# Patient Record
Sex: Female | Born: 1970 | Race: Black or African American | Hispanic: No | Marital: Married | Smoking: Never smoker
Health system: Southern US, Community
[De-identification: ages and names within clinical notes are randomized; demographics above are authoritative.]

## PROBLEM LIST (undated history)

## (undated) DIAGNOSIS — Q180 Sinus, fistula and cyst of branchial cleft: Secondary | ICD-10-CM

## (undated) DIAGNOSIS — I10 Essential (primary) hypertension: Secondary | ICD-10-CM

## (undated) DIAGNOSIS — J45909 Unspecified asthma, uncomplicated: Secondary | ICD-10-CM

## (undated) HISTORY — PX: HERNIA REPAIR: SHX51

## (undated) HISTORY — PX: TONSILLECTOMY: SUR1361

## (undated) HISTORY — PX: CERVICAL ABLATION: SHX5771

## (undated) HISTORY — PX: HAND SURGERY: SHX662

---

## 1997-08-20 ENCOUNTER — Inpatient Hospital Stay (HOSPITAL_COMMUNITY): Admission: AD | Admit: 1997-08-20 | Discharge: 1997-08-20 | Payer: Self-pay | Admitting: Obstetrics & Gynecology

## 1997-09-17 ENCOUNTER — Encounter (HOSPITAL_COMMUNITY): Admission: RE | Admit: 1997-09-17 | Discharge: 1997-11-01 | Payer: Self-pay | Admitting: Obstetrics and Gynecology

## 1997-10-01 ENCOUNTER — Inpatient Hospital Stay (HOSPITAL_COMMUNITY): Admission: AD | Admit: 1997-10-01 | Discharge: 1997-10-03 | Payer: Self-pay | Admitting: Obstetrics and Gynecology

## 1997-10-29 ENCOUNTER — Inpatient Hospital Stay (HOSPITAL_COMMUNITY): Admission: AD | Admit: 1997-10-29 | Discharge: 1997-11-02 | Payer: Self-pay | Admitting: *Deleted

## 1997-11-03 ENCOUNTER — Inpatient Hospital Stay (HOSPITAL_COMMUNITY): Admission: AD | Admit: 1997-11-03 | Discharge: 1997-11-03 | Payer: Self-pay | Admitting: Obstetrics and Gynecology

## 1997-12-20 ENCOUNTER — Other Ambulatory Visit: Admission: RE | Admit: 1997-12-20 | Discharge: 1997-12-20 | Payer: Self-pay | Admitting: *Deleted

## 1998-01-06 ENCOUNTER — Ambulatory Visit (HOSPITAL_BASED_OUTPATIENT_CLINIC_OR_DEPARTMENT_OTHER): Admission: RE | Admit: 1998-01-06 | Discharge: 1998-01-06 | Payer: Self-pay | Admitting: Surgery

## 2010-08-21 ENCOUNTER — Ambulatory Visit (HOSPITAL_COMMUNITY): Admission: RE | Admit: 2010-08-21 | Payer: Self-pay | Source: Ambulatory Visit | Admitting: Obstetrics and Gynecology

## 2012-11-29 ENCOUNTER — Encounter (HOSPITAL_BASED_OUTPATIENT_CLINIC_OR_DEPARTMENT_OTHER): Payer: Self-pay | Admitting: *Deleted

## 2012-11-29 ENCOUNTER — Emergency Department (HOSPITAL_BASED_OUTPATIENT_CLINIC_OR_DEPARTMENT_OTHER)
Admission: EM | Admit: 2012-11-29 | Discharge: 2012-11-30 | Disposition: A | Discharge status: Home / Self Care | Payer: BC Managed Care – PPO | Attending: Emergency Medicine | Admitting: Emergency Medicine

## 2012-11-29 DIAGNOSIS — Z87798 Personal history of other (corrected) congenital malformations: Secondary | ICD-10-CM | POA: Insufficient documentation

## 2012-11-29 DIAGNOSIS — M542 Cervicalgia: Secondary | ICD-10-CM | POA: Insufficient documentation

## 2012-11-29 HISTORY — DX: Sinus, fistula and cyst of branchial cleft: Q18.0

## 2012-11-29 NOTE — ED Notes (Addendum)
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 C/O sore throat onset this a.m. Hurts to swallow "like something stuck in throat" Clr sputum.

## 2012-11-30 ENCOUNTER — Emergency Department (HOSPITAL_BASED_OUTPATIENT_CLINIC_OR_DEPARTMENT_OTHER): Payer: BC Managed Care – PPO

## 2012-11-30 LAB — BASIC METABOLIC PANEL
CO2: 24 mEq/L (ref 19–32)
Calcium: 9.1 mg/dL (ref 8.4–10.5)
Chloride: 103 mEq/L (ref 96–112)
Creatinine, Ser: 1.1 mg/dL (ref 0.50–1.10)
Glucose, Bld: 100 mg/dL — ABNORMAL HIGH (ref 70–99)

## 2012-11-30 LAB — CBC WITH DIFFERENTIAL/PLATELET
Basophils Absolute: 0 10*3/uL (ref 0.0–0.1)
Eosinophils Absolute: 0.2 10*3/uL (ref 0.0–0.7)
Eosinophils Relative: 2 % (ref 0–5)
HCT: 38.8 % (ref 36.0–46.0)
Lymphocytes Relative: 26 % (ref 12–46)
Lymphs Abs: 2.2 10*3/uL (ref 0.7–4.0)
MCH: 29.1 pg (ref 26.0–34.0)
MCV: 84.9 fL (ref 78.0–100.0)
Monocytes Absolute: 0.5 10*3/uL (ref 0.1–1.0)
Platelets: 185 10*3/uL (ref 150–400)
RDW: 12.1 % (ref 11.5–15.5)
WBC: 8.4 10*3/uL (ref 4.0–10.5)

## 2012-11-30 MED ORDER — IOHEXOL 300 MG/ML  SOLN
80.0000 mL | Freq: Once | INTRAMUSCULAR | Status: AC | PRN
  Administered 2012-11-30: 80 mL via INTRAVENOUS

## 2012-11-30 MED ORDER — SODIUM CHLORIDE 0.9 % IV SOLN: INTRAVENOUS | Status: DC

## 2012-11-30 NOTE — ED Provider Notes (Signed)
History    This chart was scribed for Lacey Seamen, MD by Quintella Reichert, ED scribe.  This patient was seen in room MH02/MH02 and the patient's care was started at 12:14 AM.   CSN: [id number]  Arrival date & time 11/29/12  2332      Chief Complaint  Patient presents with  . Sore Throat     The history is provided by the patient. No language interpreter was used.    HPI Comments: Lacey Lara is a 42 y.o. 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  Pain is exacerbated by swallowing and she states she has been spitting up mucus trying to clear her throat.  She reports that symptoms feel exactly like prior flare-ups of her cyst.  Patient has had multiple surgeries for her cyst and she states that the majority of it was excised.  She notes that she was hospitalized for a flair-up 4 years ago.  Her cyst has been diagnosed in the past via CT-scan.    Past Medical History  Diagnosis Date  . Cyst of branchial cleft     Past Surgical History  Procedure Laterality Date  . Tonsillectomy    . Cesarean section    . Hernia repair    . Hand surgery    . Cervical ablation      History reviewed. No pertinent family history.  History  Substance Use Topics  . Smoking status: Never Smoker   . Smokeless tobacco: Not on file  . Alcohol Use: No    OB History   Grav Para Term Preterm Abortions TAB SAB Ect Mult Living                  Review of Systems A complete 10 system review of systems was obtained and all systems are negative except as noted in the HPI and PMH.    Allergies  Review of patient's allergies indicates no known allergies.  Home Medications  No current outpatient prescriptions on file.  BP 152/99  Pulse 88  Temp(Src) 98.4 F (36.9 C) (Oral)  Resp 20  Ht 5\' 8"  (1.727 m)  Wt 185 lb (83.915 kg)  BMI 28.14 kg/m2  SpO2  100%  Physical Exam  Nursing note and vitals reviewed. General: Well-developed, well-nourished female in no acute distress; appearance consistent with age of record HENT: normocephalic, atraumatic  Slight asymmetry of posterior pharynx, slightly more prominent on left.   Eyes: pupils equal round and reactive to light; extraocular muscles intact Neck: supple. Soft tissue tenderness of left anterior neck.  Tactile fullness just above the center of an old well-healed surgical scar.   Heart: regular rate and rhythm; no murmurs, rubs or gallops Lungs: clear to auscultation bilaterally. No stridor. Abdomen: soft; nondistended; nontender; no masses or hepatosplenomegaly; bowel sounds present Extremities: No deformity; full range of motion; pulses normal Neurologic: Awake, alert and oriented; motor function intact in all extremities and symmetric; no facial droop Skin: Warm and dry Psychiatric: Normal mood and affect   ED Course  Procedures (including critical care time)  DIAGNOSTIC STUDIES: Oxygen Saturation is 100% on room air, normal by my interpretation.    COORDINATION OF CARE: 12:18 AM-Discussed treatment plan which includes CT-scan with pt at bedside and pt agreed to plan.      MDM   Nursing notes and vitals signs, including pulse oximetry, reviewed.  Summary of this visit's results,  reviewed by myself:  Labs:  Results for orders placed during the hospital encounter of 11/29/12 (from the past 24 hour(s))  BASIC METABOLIC PANEL     Status: Abnormal   Collection Time    11/30/12 12:54 AM      Result Value Range   Sodium 136  135 - 145 mEq/L   Potassium 3.3 (*) 3.5 - 5.1 mEq/L   Chloride 103  96 - 112 mEq/L   CO2 24  19 - 32 mEq/L   Glucose, Bld 100 (*) 70 - 99 mg/dL   BUN 14  6 - 23 mg/dL   Creatinine, Ser 6.29  0.50 - 1.10 mg/dL   Calcium 9.1  8.4 - 52.8 mg/dL   GFR calc non Af Amer 62 (*) >90 mL/min   GFR calc Af Amer 72 (*) >90 mL/min  CBC WITH DIFFERENTIAL      Status: None   Collection Time    11/30/12 12:54 AM      Result Value Range   WBC 8.4  4.0 - 10.5 K/uL   RBC 4.57  3.87 - 5.11 MIL/uL   Hemoglobin 13.3  12.0 - 15.0 g/dL   HCT 41.3  24.4 - 01.0 %   MCV 84.9  78.0 - 100.0 fL   MCH 29.1  26.0 - 34.0 pg   MCHC 34.3  30.0 - 36.0 g/dL   RDW 27.2  53.6 - 64.4 %   Platelets 185  150 - 400 K/uL   Neutrophils Relative % 65  43 - 77 %   Neutro Abs 5.5  1.7 - 7.7 K/uL   Lymphocytes Relative 26  12 - 46 %   Lymphs Abs 2.2  0.7 - 4.0 K/uL   Monocytes Relative 6  3 - 12 %   Monocytes Absolute 0.5  0.1 - 1.0 K/uL   Eosinophils Relative 2  0 - 5 %   Eosinophils Absolute 0.2  0.0 - 0.7 K/uL   Basophils Relative 0  0 - 1 %   Basophils Absolute 0.0  0.0 - 0.1 K/uL    Imaging Studies: Ct Soft Tissue Neck W Contrast  11/30/2012   *RADIOLOGY REPORT*  Clinical Data: Worsening left side sore throat, exacerbated by swallowing, spitting up mucus, trying to clear throat, past history of left branchial cleft cyst  CT NECK WITH CONTRAST  Technique:  Multidetector CT imaging of the neck was performed with intravenous contrast. Sagittal and coronal MPR images reconstructed from axial data set.  Contrast: 80mL OMNIPAQUE IOHEXOL 300 MG/ML  SOLN  Comparison: None  Findings: Visualized intracranial structures unremarkable. Vascular structures appear grossly patent. Lung apices clear. Question cleft versus scar within left thyroid lobe. Symmetric appearance of the parotid and submandibular glands. Scattered normal to upper normal-sized cervical lymph nodes bilaterally. Unremarkable parapharyngeal soft tissues.  No abnormal fluid collection identified in cervical region to suggest abscess or cyst. No mass or adenopathy. Degenerative disc disease changes at C5-C6 and C6-C7. Prevertebral soft tissues normal thickness.  IMPRESSION: No acute soft tissue abnormalities are seen within the neck. Specifically, no evidence of abscess or cyst identified.   Original Report Authenticated  By: Ulyses Southward, M.D.    2:00 AM Patient advised of CT findings. Should symptoms worsen we will refer her to ENT.      I personally performed the services described in this documentation, which was scribed in my presence.  The recorded information has been reviewed and is accurate.   Carlisle Beers Jeb Schloemer, MD 11/30/12 0200

## 2012-11-30 NOTE — ED Notes (Signed)
Pt reports Hx of Branchial cyst in throat that she had removed 1999 and reports was hospitalized four years ago for same. Currently states having trouble swallowing and feels the urge to spit frequently. Reports pain to Left neck 6-7/10. No stridor or SOB noted lungs clear

## 2012-11-30 NOTE — ED Notes (Signed)
Labs drawn with iv start 

## 2013-07-02 ENCOUNTER — Emergency Department (HOSPITAL_BASED_OUTPATIENT_CLINIC_OR_DEPARTMENT_OTHER): Payer: BC Managed Care – PPO

## 2013-07-02 ENCOUNTER — Encounter (HOSPITAL_BASED_OUTPATIENT_CLINIC_OR_DEPARTMENT_OTHER): Payer: Self-pay | Admitting: Emergency Medicine

## 2013-07-02 ENCOUNTER — Emergency Department (HOSPITAL_BASED_OUTPATIENT_CLINIC_OR_DEPARTMENT_OTHER)
Admission: EM | Admit: 2013-07-02 | Discharge: 2013-07-02 | Disposition: A | Discharge status: Home / Self Care | Payer: BC Managed Care – PPO | Attending: Emergency Medicine | Admitting: Emergency Medicine

## 2013-07-02 DIAGNOSIS — R000 Tachycardia, unspecified: Secondary | ICD-10-CM | POA: Insufficient documentation

## 2013-07-02 DIAGNOSIS — J45901 Unspecified asthma with (acute) exacerbation: Secondary | ICD-10-CM | POA: Insufficient documentation

## 2013-07-02 DIAGNOSIS — R079 Chest pain, unspecified: Secondary | ICD-10-CM

## 2013-07-02 DIAGNOSIS — R071 Chest pain on breathing: Secondary | ICD-10-CM | POA: Insufficient documentation

## 2013-07-02 DIAGNOSIS — R42 Dizziness and giddiness: Secondary | ICD-10-CM | POA: Insufficient documentation

## 2013-07-02 HISTORY — DX: Unspecified asthma, uncomplicated: J45.909

## 2013-07-02 LAB — CBC WITH DIFFERENTIAL/PLATELET
BASOS ABS: 0 10*3/uL (ref 0.0–0.1)
BASOS PCT: 0 % (ref 0–1)
EOS ABS: 0.1 10*3/uL (ref 0.0–0.7)
EOS PCT: 1 % (ref 0–5)
HCT: 41.4 % (ref 36.0–46.0)
Hemoglobin: 14 g/dL (ref 12.0–15.0)
Lymphocytes Relative: 27 % (ref 12–46)
Lymphs Abs: 1.6 10*3/uL (ref 0.7–4.0)
MCH: 28.8 pg (ref 26.0–34.0)
MCHC: 33.8 g/dL (ref 30.0–36.0)
MCV: 85.2 fL (ref 78.0–100.0)
Monocytes Absolute: 0.3 10*3/uL (ref 0.1–1.0)
Monocytes Relative: 4 % (ref 3–12)
Neutro Abs: 4.1 10*3/uL (ref 1.7–7.7)
Neutrophils Relative %: 68 % (ref 43–77)
PLATELETS: 186 10*3/uL (ref 150–400)
RBC: 4.86 MIL/uL (ref 3.87–5.11)
RDW: 12 % (ref 11.5–15.5)
WBC: 6 10*3/uL (ref 4.0–10.5)

## 2013-07-02 LAB — COMPREHENSIVE METABOLIC PANEL
ALBUMIN: 4 g/dL (ref 3.5–5.2)
ALK PHOS: 64 U/L (ref 39–117)
ALT: 12 U/L (ref 0–35)
AST: 18 U/L (ref 0–37)
BILIRUBIN TOTAL: 0.6 mg/dL (ref 0.3–1.2)
BUN: 10 mg/dL (ref 6–23)
CHLORIDE: 101 meq/L (ref 96–112)
CO2: 20 mEq/L (ref 19–32)
Calcium: 9.9 mg/dL (ref 8.4–10.5)
Creatinine, Ser: 1 mg/dL (ref 0.50–1.10)
GFR calc Af Amer: 81 mL/min — ABNORMAL LOW (ref >90)
GFR calc non Af Amer: 69 mL/min — ABNORMAL LOW (ref >90)
Glucose, Bld: 109 mg/dL — ABNORMAL HIGH (ref 70–99)
POTASSIUM: 3.6 meq/L — AB (ref 3.7–5.3)
Sodium: 138 mEq/L (ref 137–147)
Total Protein: 7.8 g/dL (ref 6.0–8.3)

## 2013-07-02 LAB — TROPONIN I: Troponin I: <0.3 ng/mL (ref <0.30)

## 2013-07-02 LAB — D-DIMER, QUANTITATIVE (NOT AT ARMC): D DIMER QUANT: 1.58 ug{FEU}/mL — AB (ref 0.00–0.48)

## 2013-07-02 MED ORDER — IOHEXOL 350 MG/ML SOLN
100.0000 mL | Freq: Once | INTRAVENOUS | Status: AC | PRN
  Administered 2013-07-02: 100 mL via INTRAVENOUS

## 2013-07-02 NOTE — Discharge Instructions (Signed)
Chest Pain (Nonspecific) °Chest pain has many causes. Your pain could be caused by something serious, such as a heart attack or a blood clot in the lungs. It could also be caused by something less serious, such as a chest bruise or a virus. Follow up with your doctor. More lab tests or other studies may be needed to find the cause of your pain. Most of the time, nonspecific chest pain will improve within 2 to 3 days of rest and mild pain medicine. °HOME CARE °· For chest bruises, you may put ice on the sore area for 15-20 minutes, 03-04 times a day. Do this only if it makes you feel better. °· Put ice in a plastic bag. °· Place a towel between the skin and the bag. °· Rest for the next 2 to 3 days. °· Go back to work if the pain improves. °· See your doctor if the pain lasts longer than 1 to 2 weeks. °· Only take medicine as told by your doctor. °· Quit smoking if you smoke. °GET HELP RIGHT AWAY IF:  °· There is more pain or pain that spreads to the arm, neck, jaw, back, or belly (abdomen). °· You have shortness of breath. °· You cough more than usual or cough up blood. °· You have very bad back or belly pain, feel sick to your stomach (nauseous), or throw up (vomit). °· You have very bad weakness. °· You pass out (faint). °· You have a fever. °Any of these problems may be serious and may be an emergency. Do not wait to see if the problems will go away. Get medical help right away. Call your local emergency services 911 in [place] Do not drive yourself to the hospital. °MAKE SURE YOU:  °· Understand these instructions. °· Will watch this condition. °· Will get help right away if you or your child is not doing well or gets worse. °Document Released: 11/14/2007 Document Revised: 08/20/2011 Document Reviewed: 11/14/2007 °[employer]® Patient Information ©2014 [employer]. ° °

## 2013-07-02 NOTE — ED Notes (Signed)
Pt c/o right chest pain x 6 days intermittent and aching with shortness of breath. Pt sts pain improves with rest and "burping".  Pt also sts she feels dizzy in the mornings but sts she recently started taking trazodone for sleep disorder and thinks it is related.

## 2013-07-02 NOTE — ED Provider Notes (Signed)
CSN: [id number]     Arrival date & time 07/02/13  1200 History   First MD Initiated Contact with Patient 07/02/13 1316     Chief Complaint  Patient presents with  . Chest Pain  . Shortness of Breath    HPI Patient presents with shortness of breath and intermittent pleuritic aching chest pain for the last 6 days.  Patient said that pain improves with rest and burping she'll some dizziness in the morning recently started taking trazodone for sleep disordered 6 this may related to side effects. Past Medical History  Diagnosis Date  . Cyst of branchial cleft   . Asthma    Past Surgical History  Procedure Laterality Date  . Tonsillectomy    . Cesarean section    . Hernia repair    . Hand surgery    . Cervical ablation     No family history on file. History  Substance Use Topics  . Smoking status: Never Smoker   . Smokeless tobacco: Not on file  . Alcohol Use: No   OB History   Grav Para Term Preterm Abortions TAB SAB Ect Mult Living                 Review of Systems All other systems reviewed and are negative Allergies  Review of patient's allergies indicates no known allergies.  Home Medications   Current Outpatient Rx  Name  Route  Sig  Dispense  Refill  . TRAZODONE HCL PO   Oral   Take by mouth.          There were no vitals taken for this visit. Physical Exam  Nursing note and vitals reviewed. Constitutional: She is oriented to person, place, and time. She appears well-developed and well-nourished. No distress.  HENT:  Head: Normocephalic and atraumatic.  Eyes: Pupils are equal, round, and reactive to light.  Neck: Normal range of motion.  Cardiovascular: Intact distal pulses.  Tachycardia present.   Pulmonary/Chest: No respiratory distress.  Abdominal: Normal appearance. She exhibits no distension.  Musculoskeletal: Normal range of motion.  Neurological: She is alert and oriented to person, place, and time. No cranial nerve deficit.  Skin: Skin is warm  and dry. No rash noted.  Psychiatric: She has a normal mood and affect. Her behavior is normal.    ED Course  Procedures (including critical care time) Labs Review Labs Reviewed  COMPREHENSIVE METABOLIC PANEL - Abnormal; Notable for the following:    Potassium 3.6 (*)    Glucose, Bld 109 (*)    GFR calc non Af Amer 69 (*)    GFR calc Af Amer 81 (*)    All other components within normal limits  D-DIMER, QUANTITATIVE - Abnormal; Notable for the following:    D-Dimer, Quant 1.58 (*)    All other components within normal limits  CBC WITH DIFFERENTIAL  TROPONIN I   Imaging Review Dg Chest 2 View  07/02/2013   CLINICAL DATA:  Short of breath  EXAM: CHEST  2 VIEW  COMPARISON:  None.  FINDINGS: The heart size and mediastinal contours are within normal limits. Both lungs are clear. The visualized skeletal structures are unremarkable.  IMPRESSION: No active cardiopulmonary disease.   Electronically Signed   By: Marlan Palauharles  Clark M.D.   On: 07/02/2013 14:15   Ct Angio Chest Pe W/cm &/or Wo Cm  07/02/2013     IMPRESSION: No pulmonary embolism.  No acute cardiopulmonary process.  Mild paraseptal and very minimal centrilobular emphysema with lung  base pleural thickening and atelectasis.   Electronically Signed   By: Awilda Metro   On: 07/02/2013 14:48    EKG Interpretation    Date/Time:  Thursday July 02 2013 12:09:43 EST Ventricular Rate:  101 PR Interval:  130 QRS Duration: 82 QT Interval:  330 QTC Calculation: 427 R Axis:   57 Text Interpretation:  Sinus tachycardia Otherwise normal ECG No previous tracing Confirmed by Becki Mccaskill  MD, Deneen Slager ([id number]) on 07/02/2013 1:33:26 PM            MDM   1. Chest pain        Nelia Shi, MD 07/02/13 1459

## 2016-01-09 ENCOUNTER — Encounter (HOSPITAL_BASED_OUTPATIENT_CLINIC_OR_DEPARTMENT_OTHER): Payer: Self-pay | Admitting: Emergency Medicine

## 2016-01-09 ENCOUNTER — Emergency Department (HOSPITAL_BASED_OUTPATIENT_CLINIC_OR_DEPARTMENT_OTHER)
Admission: EM | Admit: 2016-01-09 | Discharge: 2016-01-09 | Disposition: A | Discharge status: Home / Self Care | Payer: Self-pay | Attending: Emergency Medicine | Admitting: Emergency Medicine

## 2016-01-09 ENCOUNTER — Emergency Department (HOSPITAL_BASED_OUTPATIENT_CLINIC_OR_DEPARTMENT_OTHER): Payer: Self-pay

## 2016-01-09 DIAGNOSIS — J45909 Unspecified asthma, uncomplicated: Secondary | ICD-10-CM | POA: Insufficient documentation

## 2016-01-09 DIAGNOSIS — R42 Dizziness and giddiness: Secondary | ICD-10-CM | POA: Insufficient documentation

## 2016-01-09 DIAGNOSIS — R0789 Other chest pain: Secondary | ICD-10-CM | POA: Insufficient documentation

## 2016-01-09 DIAGNOSIS — R110 Nausea: Secondary | ICD-10-CM | POA: Insufficient documentation

## 2016-01-09 LAB — CBC WITH DIFFERENTIAL/PLATELET
BASOS ABS: 0 10*3/uL (ref 0.0–0.1)
Basophils Relative: 0 %
Eosinophils Absolute: 0.1 10*3/uL (ref 0.0–0.7)
Eosinophils Relative: 1 %
HCT: 39.5 % (ref 36.0–46.0)
HEMOGLOBIN: 13.3 g/dL (ref 12.0–15.0)
LYMPHS ABS: 2.9 10*3/uL (ref 0.7–4.0)
Lymphocytes Relative: 37 %
MCH: 28.7 pg (ref 26.0–34.0)
MCHC: 33.7 g/dL (ref 30.0–36.0)
MCV: 85.3 fL (ref 78.0–100.0)
MONO ABS: 0.2 10*3/uL (ref 0.1–1.0)
MONOS PCT: 2 %
NEUTROS PCT: 60 %
Neutro Abs: 4.6 10*3/uL (ref 1.7–7.7)
Platelets: 201 10*3/uL (ref 150–400)
RBC: 4.63 MIL/uL (ref 3.87–5.11)
RDW: 12.1 % (ref 11.5–15.5)
WBC: 7.8 10*3/uL (ref 4.0–10.5)

## 2016-01-09 LAB — TROPONIN I

## 2016-01-09 LAB — D-DIMER, QUANTITATIVE (NOT AT ARMC)

## 2016-01-09 LAB — BASIC METABOLIC PANEL
Anion gap: 7 (ref 5–15)
BUN: 11 mg/dL (ref 6–20)
CHLORIDE: 104 mmol/L (ref 101–111)
CO2: 25 mmol/L (ref 22–32)
Calcium: 8.9 mg/dL (ref 8.9–10.3)
Creatinine, Ser: 1.08 mg/dL — ABNORMAL HIGH (ref 0.44–1.00)
Glucose, Bld: 93 mg/dL (ref 65–99)
POTASSIUM: 3.7 mmol/L (ref 3.5–5.1)
SODIUM: 136 mmol/L (ref 135–145)

## 2016-01-09 MED ORDER — ONDANSETRON HCL 4 MG/2ML IJ SOLN
4.0000 mg | Freq: Once | INTRAMUSCULAR | Status: AC
  Administered 2016-01-09: 4 mg via INTRAVENOUS
  Filled 2016-01-09: qty 2

## 2016-01-09 MED ORDER — NAPROXEN 500 MG PO TABS: 500.0000 mg | ORAL_TABLET | Freq: Two times a day (BID) | ORAL | 0 refills | Status: DC

## 2016-01-09 MED ORDER — MORPHINE SULFATE (PF) 4 MG/ML IV SOLN
4.0000 mg | Freq: Once | INTRAVENOUS | Status: AC
  Administered 2016-01-09: 4 mg via INTRAVENOUS
  Filled 2016-01-09: qty 1

## 2016-01-09 MED ORDER — SODIUM CHLORIDE 0.9 % IV BOLUS (SEPSIS)
1000.0000 mL | Freq: Once | INTRAVENOUS | Status: AC
  Administered 2016-01-09: 1000 mL via INTRAVENOUS

## 2016-01-09 NOTE — ED Notes (Signed)
MD at bedside. 

## 2016-01-09 NOTE — ED Provider Notes (Signed)
[facility] Provider Note   CSN: [id number] Arrival date & time: 01/09/16  1555  First Provider Contact:   First MD Initiated Contact with Patient 01/09/16 1629      By signing my name below, I, Soijett Blue, attest that this documentation has been prepared under the direction and in the presence of Fayrene Helper, PA-C Electronically Signed: Soijett Blue, ED Scribe. 01/09/16. 4:40 PM   History   Chief Complaint Chief Complaint  Patient presents with  . Chest Pain    HPI  Lacey Lara is a 45 y.o. female with a medical hx of asthma who presents to the Emergency Department complaining of 5/10, sudden onset, left sided, CP onset 9:30 AM this morning. She notes that she was at a conference at work when the CP began. She describes the CP as pressure sensation to her chest and a paresthesia sensation to her body. Pt denies any strenuous activity prior to the onset of her symptoms. Pt states that her CP has mildly improved since onset. Pt reports that she has had multiple stress factors in her life recently, but she denies her symptoms feeling similar to a panic attack. Denies having CP like this in the past. Pt reports that her CP is worsened with deep breathing. Denies alleviating factors.   She states that she is having associated symptoms of nausea, lightheadedness, and belching. She states that she has not tried any medications for the relief for her symptoms. She denies vomiting, SOB, diaphoresis, back pain, abdominal pain, fever, chills, calf tenderness, leg swelling, nipple discharge, and any other symptoms. Denies ETOH use or smoking cigarettes. Pt reports that her mother died of CHF and that her father has had a quadruple bypass. Pt denies recent travel, immobilization, surgery, estrogen use, birth control use, or PMHx of blood clots. Denies allergies to any medications.    The history is provided by the patient. No language interpreter was used.    Past Medical  History:  Diagnosis Date  . Asthma   . Cyst of branchial cleft     There are no active problems to display for this patient.   Past Surgical History:  Procedure Laterality Date  . CERVICAL ABLATION    . CESAREAN SECTION    . HAND SURGERY    . HERNIA REPAIR    . TONSILLECTOMY      OB History    No data available       Home Medications    Prior to Admission medications   Not on File    Family History No family history on file.  Social History Social History  Substance Use Topics  . Smoking status: Never Smoker  . Smokeless tobacco: Not on file  . Alcohol use No     Allergies   Review of patient's allergies indicates no known allergies.   Review of Systems Review of Systems  Constitutional: Negative for diaphoresis.  Respiratory: Negative for shortness of breath.   Cardiovascular: Positive for chest pain. Negative for leg swelling.  Gastrointestinal: Positive for nausea. Negative for abdominal pain and vomiting.  Musculoskeletal: Negative for back pain and myalgias.  Neurological: Positive for light-headedness.     Physical Exam Updated Vital Signs BP 129/89 (BP Location: Right Arm)   Pulse 111   Temp 99.1 F (37.3 C) (Oral)   Resp 20   Ht  (1.702 m)   Wt 185 lb (83.9 kg)   SpO2 100%   BMI 28.98 kg/m   Physical  Exam  Constitutional: She is oriented to person, place, and time. She appears well-developed and well-nourished. No distress.  HENT:  Head: Normocephalic and atraumatic.  Eyes: EOM are normal.  Neck: Neck supple. Carotid bruit is not present.  No carotid bruit.   Cardiovascular: Normal rate, regular rhythm and normal heart sounds.  Exam reveals no gallop and no friction rub.   No murmur heard. Pulses:      Radial pulses are 2+ on the right side, and 2+ on the left side.  Radial pulse 2+ bilaterally.  Pulmonary/Chest: Effort normal and breath sounds normal. No respiratory distress. She has no wheezes. She has no rales. She  exhibits tenderness. She exhibits no crepitus.  Reproducible left upper chest wall TTP. No crepitus, emphysema, or overlying skin changes.  Abdominal: Soft. She exhibits no distension. There is no tenderness.  Musculoskeletal: Normal range of motion.  Neurological: She is alert and oriented to person, place, and time.  Skin: Skin is warm and dry.  Psychiatric: She has a normal mood and affect. Her behavior is normal.  Nursing note and vitals reviewed.    ED Treatments / Results  DIAGNOSTIC STUDIES: Oxygen Saturation is 100% on RA, nl by my interpretation.    COORDINATION OF CARE: 4:39 PM Discussed treatment plan with pt at bedside which includes EKG, labs, CXR, and pt agreed to plan.   Labs (all labs ordered are listed, but only abnormal results are displayed) Labs Reviewed  BASIC METABOLIC PANEL - Abnormal; Notable for the following:       Result Value   Creatinine, Ser 1.08 (*)    All other components within normal limits  CBC WITH DIFFERENTIAL/PLATELET  TROPONIN I  D-DIMER, QUANTITATIVE (NOT AT [facility])    EKG  EKG Interpretation None       Radiology Dg Chest 2 View  Result Date: 01/09/2016 CLINICAL DATA:  Shortness of breath with left-sided chest pain since this morning; history of asthma. EXAM: CHEST  2 VIEW COMPARISON:  Chest x-ray and chest CT scan of July 02, 2013 FINDINGS: The lungs are borderline hyperinflated. There is no pneumothorax, pneumomediastinum, or pleural effusion. There is no interstitial nor alveolar infiltrate. The heart and pulmonary vascularity are normal. The mediastinum is normal in width. The bony thorax exhibits no acute abnormality. IMPRESSION: There is no evidence of pneumonia nor other acute cardiopulmonary abnormality. Electronically Signed   By: David  [place] M.D.   On: 01/09/2016 16:59    Procedures Procedures (including critical care time)  Medications Ordered in ED Medications - No data to display   Initial Impression /  Assessment and Plan / ED Course  I have reviewed the triage vital signs and the nursing notes.  Pertinent labs & imaging results that were available during my care of the patient were reviewed by me and considered in my medical decision making (see chart for details).  Clinical Course    BP 108/78 (BP Location: Left Arm)   Pulse 86   Temp 99.1 F (37.3 C) (Oral)   Resp 18   Ht 5\' 7"  (1.702 m)   Wt 83.9 kg   SpO2 100%   BMI 28.98 kg/m    Final Clinical Impressions(s) / ED Diagnoses   Final diagnoses:  Chest wall pain    New Prescriptions New Prescriptions   NAPROXEN (NAPROSYN) 500 MG TABLET    Take 1 tablet (500 mg total) by mouth 2 (two) times daily.    I personally performed the services described in this documentation,  which was scribed in my presence. The recorded information has been reviewed and is accurate.     5:15 PM Pt here with reproducible L sided chest wall tenderness.  HEART score of 1, low risk of MACE.  PERC negative.    5:47 PM D-dimer negative.  Doubt PE.  Intact pulses to all 4 extremities without neuro deficits.  Doubt dissection.  Currently I have low suspicion that pt is having an acute emergent medical condition.  I will have pt /fu with PCP for further management.  Care discussed with Dr. Fayrene Fearing.  Return precaution discussed .     Fayrene Helper, PA-C 01/09/16 1756    Rolland Porter, MD 01/20/16 [phone]

## 2018-12-22 ENCOUNTER — Emergency Department (HOSPITAL_BASED_OUTPATIENT_CLINIC_OR_DEPARTMENT_OTHER)
Admission: EM | Admit: 2018-12-22 | Discharge: 2018-12-22 | Disposition: A | Discharge status: Home / Self Care | Payer: 59 | Attending: Emergency Medicine | Admitting: Emergency Medicine

## 2018-12-22 ENCOUNTER — Other Ambulatory Visit: Payer: Self-pay

## 2018-12-22 ENCOUNTER — Emergency Department (HOSPITAL_BASED_OUTPATIENT_CLINIC_OR_DEPARTMENT_OTHER): Payer: 59

## 2018-12-22 ENCOUNTER — Encounter (HOSPITAL_BASED_OUTPATIENT_CLINIC_OR_DEPARTMENT_OTHER): Payer: Self-pay | Admitting: *Deleted

## 2018-12-22 DIAGNOSIS — Z79899 Other long term (current) drug therapy: Secondary | ICD-10-CM | POA: Insufficient documentation

## 2018-12-22 DIAGNOSIS — J45901 Unspecified asthma with (acute) exacerbation: Secondary | ICD-10-CM | POA: Diagnosis not present

## 2018-12-22 DIAGNOSIS — J45909 Unspecified asthma, uncomplicated: Secondary | ICD-10-CM | POA: Insufficient documentation

## 2018-12-22 DIAGNOSIS — R05 Cough: Secondary | ICD-10-CM | POA: Diagnosis present

## 2018-12-22 MED ORDER — PREDNISONE 10 MG (21) PO TBPK: ORAL_TABLET | Freq: Every day | ORAL | 0 refills | Status: DC

## 2018-12-22 MED ORDER — BENZONATATE 100 MG PO CAPS: 100.0000 mg | ORAL_CAPSULE | Freq: Three times a day (TID) | ORAL | 0 refills | Status: DC

## 2018-12-22 MED ORDER — AEROCHAMBER PLUS FLO-VU MEDIUM MISC
1.0000 | Freq: Once | Status: AC
  Administered 2018-12-22: 1
  Filled 2018-12-22: qty 1

## 2018-12-22 MED ORDER — ALBUTEROL SULFATE HFA 108 (90 BASE) MCG/ACT IN AERS
2.0000 | INHALATION_SPRAY | Freq: Once | RESPIRATORY_TRACT | Status: AC
Start: 2018-12-22 — End: 2018-12-22
  Administered 2018-12-22: 2 via RESPIRATORY_TRACT
  Filled 2018-12-22: qty 6.7

## 2018-12-22 MED ORDER — IPRATROPIUM BROMIDE HFA 17 MCG/ACT IN AERS
2.0000 | INHALATION_SPRAY | Freq: Once | RESPIRATORY_TRACT | Status: AC
  Administered 2018-12-22: 20:00:00 2 via RESPIRATORY_TRACT
  Filled 2018-12-22: qty 12.9

## 2018-12-22 MED ORDER — FLUTICASONE PROPIONATE 50 MCG/ACT NA SUSP: 1.0000 | Freq: Every day | NASAL | 0 refills | Status: DC

## 2018-12-22 NOTE — ED Provider Notes (Signed)
[facility] EMERGENCY DEPARTMENT Provider Note   CSN: [id number] Arrival date & time: 12/22/18  1853     History   Chief Complaint Chief Complaint  Patient presents with  . Cough    HPI Lacey Lara is a 48 y.o. female with a history of asthma who presents to the ED with complaints of cough x 2 weeks. Patient reports productive cough with yellow phlegm sputum with nasal congestion which has now lead to some chest tightness as well as soreness with coughing & at times wheezing. Sxs seem to be worsening as opposed to improving. No specific alleviating/aggravating factors other than chest soreness worse w/ coughing. Tried OTC cold/cough medicines w/o relief. States she unfortunately does not have an inhaler at home to use. This feels similar to prior asthma issues requiring steroids. Denies fever, chills, dyspnea, ear pain, sore throat, leg pain/swelling, hemoptysis, recent surgery/trauma, recent long travel, hormone use, personal hx of cancer, or hx of DVT/PE. She has not been in contact w/ anyone that has been covid 19 positive that she is aware of.     HPI  Past Medical History:  Diagnosis Date  . Asthma   . Cyst of branchial cleft     There are no active problems to display for this patient.   Past Surgical History:  Procedure Laterality Date  . CERVICAL ABLATION    . CESAREAN SECTION    . HAND SURGERY    . HERNIA REPAIR    . TONSILLECTOMY       OB History   No obstetric history on file.      Home Medications    Prior to Admission medications   Medication Sig Start Date End Date Taking? Authorizing Provider  naproxen (NAPROSYN) 500 MG tablet Take 1 tablet (500 mg total) by mouth 2 (two) times daily. 01/09/16   Domenic Moras, PA-C    Family History No family history on file.  Social History Social History   Tobacco Use  . Smoking status: Never Smoker  . Smokeless tobacco: Never Used  Substance Use Topics  . Alcohol use: No  . Drug use: No     Allergies   Patient has no known allergies.   Review of Systems Review of Systems  Constitutional: Negative for chills and fever.  HENT: Positive for congestion. Negative for ear pain, sinus pain and sore throat.   Respiratory: Positive for cough, chest tightness and wheezing. Negative for shortness of breath.   Cardiovascular: Positive for chest pain (soreness w/ coughing).  Gastrointestinal: Negative for abdominal pain, diarrhea, nausea and vomiting.  Genitourinary: Negative for dysuria.  Musculoskeletal: Negative for myalgias.  Neurological: Negative for syncope.  All other systems reviewed and are negative.  Physical Exam Updated Vital Signs BP (!) 144/92   Pulse 100   Temp 98.4 F (36.9 C) (Oral)   Resp 20   Ht 5\' 7"  (1.702 m)   Wt 83.2 kg   SpO2 99%   BMI 28.72 kg/m   Physical Exam Vitals signs and nursing note reviewed.  Constitutional:      General: She is not in acute distress.    Appearance: She is well-developed.  HENT:     Head: Normocephalic and atraumatic.     Right Ear: Ear canal normal. Tympanic membrane is not perforated, erythematous, retracted or bulging.     Left Ear: Ear canal normal. Tympanic membrane is not perforated, erythematous, retracted or bulging.     Ears:     Comments: No  mastoid erythema/swelling/tenderness.     Nose: Congestion present.     Right Sinus: No maxillary sinus tenderness or frontal sinus tenderness.     Left Sinus: No maxillary sinus tenderness or frontal sinus tenderness.     Mouth/Throat:     Pharynx: Uvula midline. No oropharyngeal exudate or posterior oropharyngeal erythema.     Comments: Posterior oropharynx is symmetric appearing. Patient tolerating own secretions without difficulty. No trismus. No drooling. No hot potato voice. No swelling beneath the tongue, submandibular compartment is soft.  Eyes:     General:        Right eye: No discharge.        Left eye: No discharge.     Conjunctiva/sclera:  Conjunctivae normal.     Pupils: Pupils are equal, round, and reactive to light.  Neck:     Musculoskeletal: Normal range of motion and neck supple. No edema or neck rigidity.  Cardiovascular:     Rate and Rhythm: Normal rate and regular rhythm.     Heart sounds: No murmur.  Pulmonary:     Effort: Pulmonary effort is normal. No respiratory distress.     Breath sounds: Wheezing (end expiratory wheeze throughout all lung fields with somewhat poor air movement. ) present. No rhonchi or rales.  Chest:     Chest wall: Tenderness (mild anterior) present.  Abdominal:     General: There is no distension.     Palpations: Abdomen is soft.     Tenderness: There is no abdominal tenderness.  Musculoskeletal:     Right lower leg: No edema.     Left lower leg: No edema.  Lymphadenopathy:     Cervical: No cervical adenopathy.  Skin:    General: Skin is warm and dry.     Findings: No rash.  Neurological:     Mental Status: She is alert.  Psychiatric:        Behavior: Behavior normal.    ED Treatments / Results  Labs (all labs ordered are listed, but only abnormal results are displayed) Labs Reviewed - No data to display  EKG None  Radiology Dg Chest 2 View  Result Date: 12/22/2018 CLINICAL DATA:  Cough for 10 days with chest tightness. EXAM: CHEST - 2 VIEW COMPARISON:  Chest x-ray dated 01/09/2016. FINDINGS: Heart size and mediastinal contours are within normal limits. Lungs are clear. No pleural effusions seen. Osseous structures about the chest are unremarkable. IMPRESSION: No active cardiopulmonary disease. No evidence of pneumonia or pulmonary edema. Electronically Signed   By: Bary RichardStan  Maynard M.D.   On: 12/22/2018 20:37    Procedures Procedures (including critical care time)  Medications Ordered in ED Medications - No data to display   Initial Impression / Assessment and Plan / ED Course  I have reviewed the triage vital signs and the nursing notes.  Pertinent labs & imaging  results that were available during my care of the patient were reviewed by me and considered in my medical decision making (see chart for details).   Patient presents to the ED w/ congestion, cough, wheezing, & chest tightness worsening over past 2 weeks. Nontoxic appearing, no apparent distress, initial vitals w/ mildly elevated BP- doubt HTN emergency. Afebrile, no sinus tenderness doubt acute bacterial sinusitis. No AOM on exam. Centor 0- doubt strep. No meningismus. Low risk wells- doubt PE. Chest soreness reproducible w/ anterior chest wall palpation. She does not appear in respiratory distress, her lung exam is however notable for poor air movement w/ expiratory wheezing  throughout. Plan for albuterol/atrovent inhaler use & CXR w/ reassessment.  CXR: no active cardiopulmonary disease, no pneumonia or pulmonary edema.   Repeat lung exam improved, moving air appropriately, no significant wheezing.   Overall suspect allergic vs. Viral process leading to mild asthma exacerbation. Feel allergic is more likely, considered covid 19, afebrile, no body aches, 14 days from onset, seems less likely offered outpatient testing- patient declined. Will tx for asthma exacerbation with prednisone w/ additional symptomatic care, inhalers to be taken home. She is not hypoxic, appears appropriate for outpatient tx. I discussed results, treatment plan, need for follow-up, and return precautions with the patient. Provided opportunity for questions, patient confirmed understanding and is in agreement with plan.     Final Clinical Impressions(s) / ED Diagnoses   Final diagnoses:  Exacerbation of asthma, unspecified asthma severity, unspecified whether persistent    ED Discharge Orders         Ordered    predniSONE (STERAPRED UNI-PAK 21 TAB) 10 MG (21) TBPK tablet  Daily     12/22/18 2141    fluticasone (FLONASE) 50 MCG/ACT nasal spray  Daily     12/22/18 2141    benzonatate (TESSALON) 100 MG capsule  Every 8  hours     12/22/18 2141           Stoney Karczewski, Pleas KochSamantha R, PA-C 12/22/18 2155    Vanetta MuldersZackowski, Scott, MD 12/23/18 1630

## 2018-12-22 NOTE — Discharge Instructions (Addendum)
You are seen in the emergency department today for a cough.  Your chest x-ray was normal.  We are treating you for an asthma exacerbation with steroids, we are also sending home with Flonase to help with congestion, and Tessalon to help with cough.  Please take medicines as prescribed.  We are also sending you home with an albuterol inhaler, use 1 to 2 puffs every 4-6 hours as needed for shortness of breath/wheezing/chest tightness.   We have prescribed you new medication(s) today. Discuss the medications prescribed today with your pharmacist as they can have adverse effects and interactions with your other medicines including over the counter and prescribed medications. Seek medical evaluation if you start to experience new or abnormal symptoms after taking one of these medicines, seek care immediately if you start to experience difficulty breathing, feeling of your throat closing, facial swelling, or rash as these could be indications of a more serious allergic reaction  Follow-up with primary care within 3 days, return to the ER for new or worsening symptoms or any other concerns.

## 2018-12-22 NOTE — ED Triage Notes (Signed)
Cough for 10 days. No relief with OTC medications. Hx of asthma. Feels like she has bronchitis.

## 2019-09-06 ENCOUNTER — Emergency Department (HOSPITAL_BASED_OUTPATIENT_CLINIC_OR_DEPARTMENT_OTHER): Admission: EM | Admit: 2019-09-06 | Discharge: 2019-09-06 | Discharge status: Against Medical Advice | Payer: 59

## 2019-09-06 ENCOUNTER — Other Ambulatory Visit: Payer: Self-pay

## 2020-03-18 IMAGING — DX CHEST - 2 VIEW
2 series · 2 of 2 positions shown · non-contrast
Comparison: Chest x-ray dated 01/09/2016.

CLINICAL DATA: Cough for 10 days with chest tightness.

EXAM:
CHEST - 2 VIEW

[chest pa]
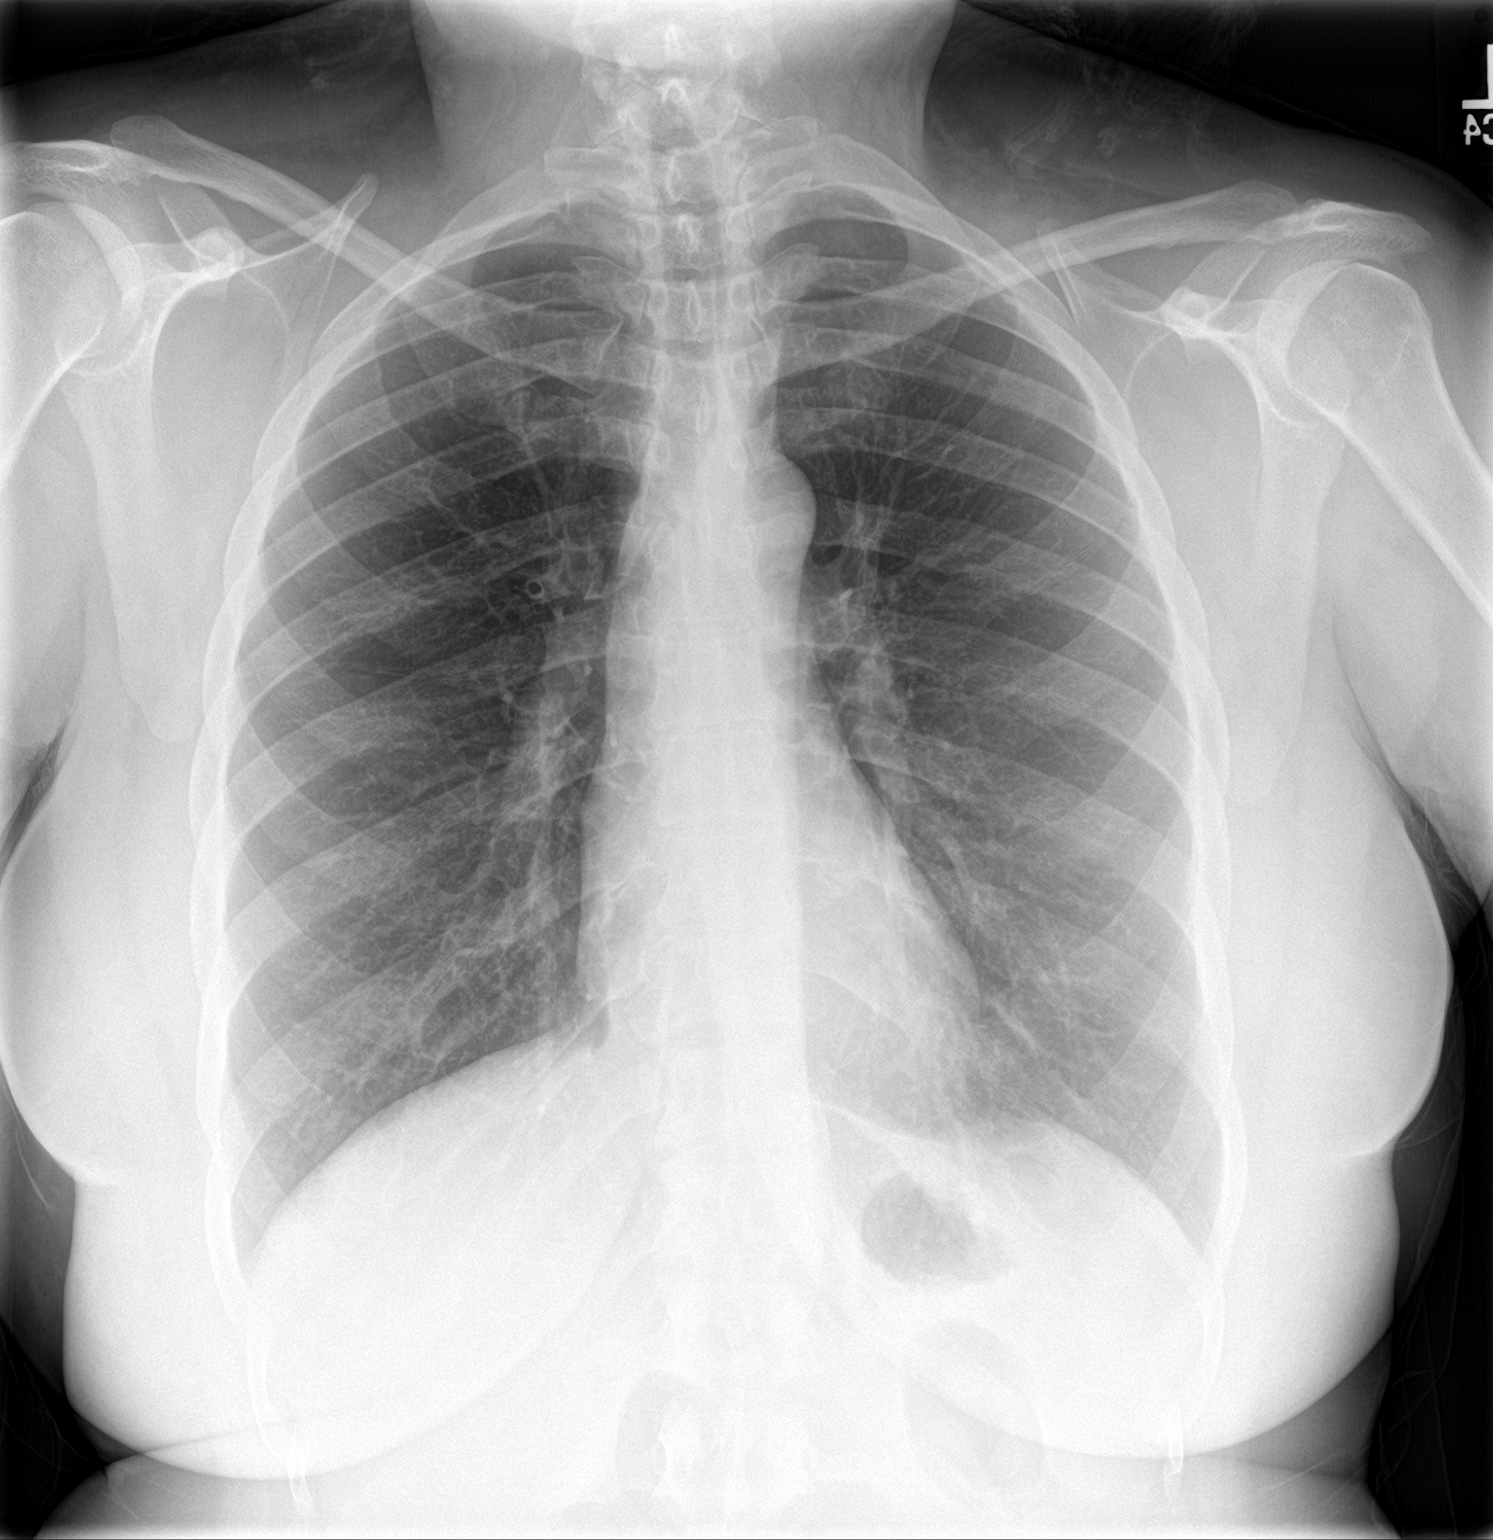

[chest lat]
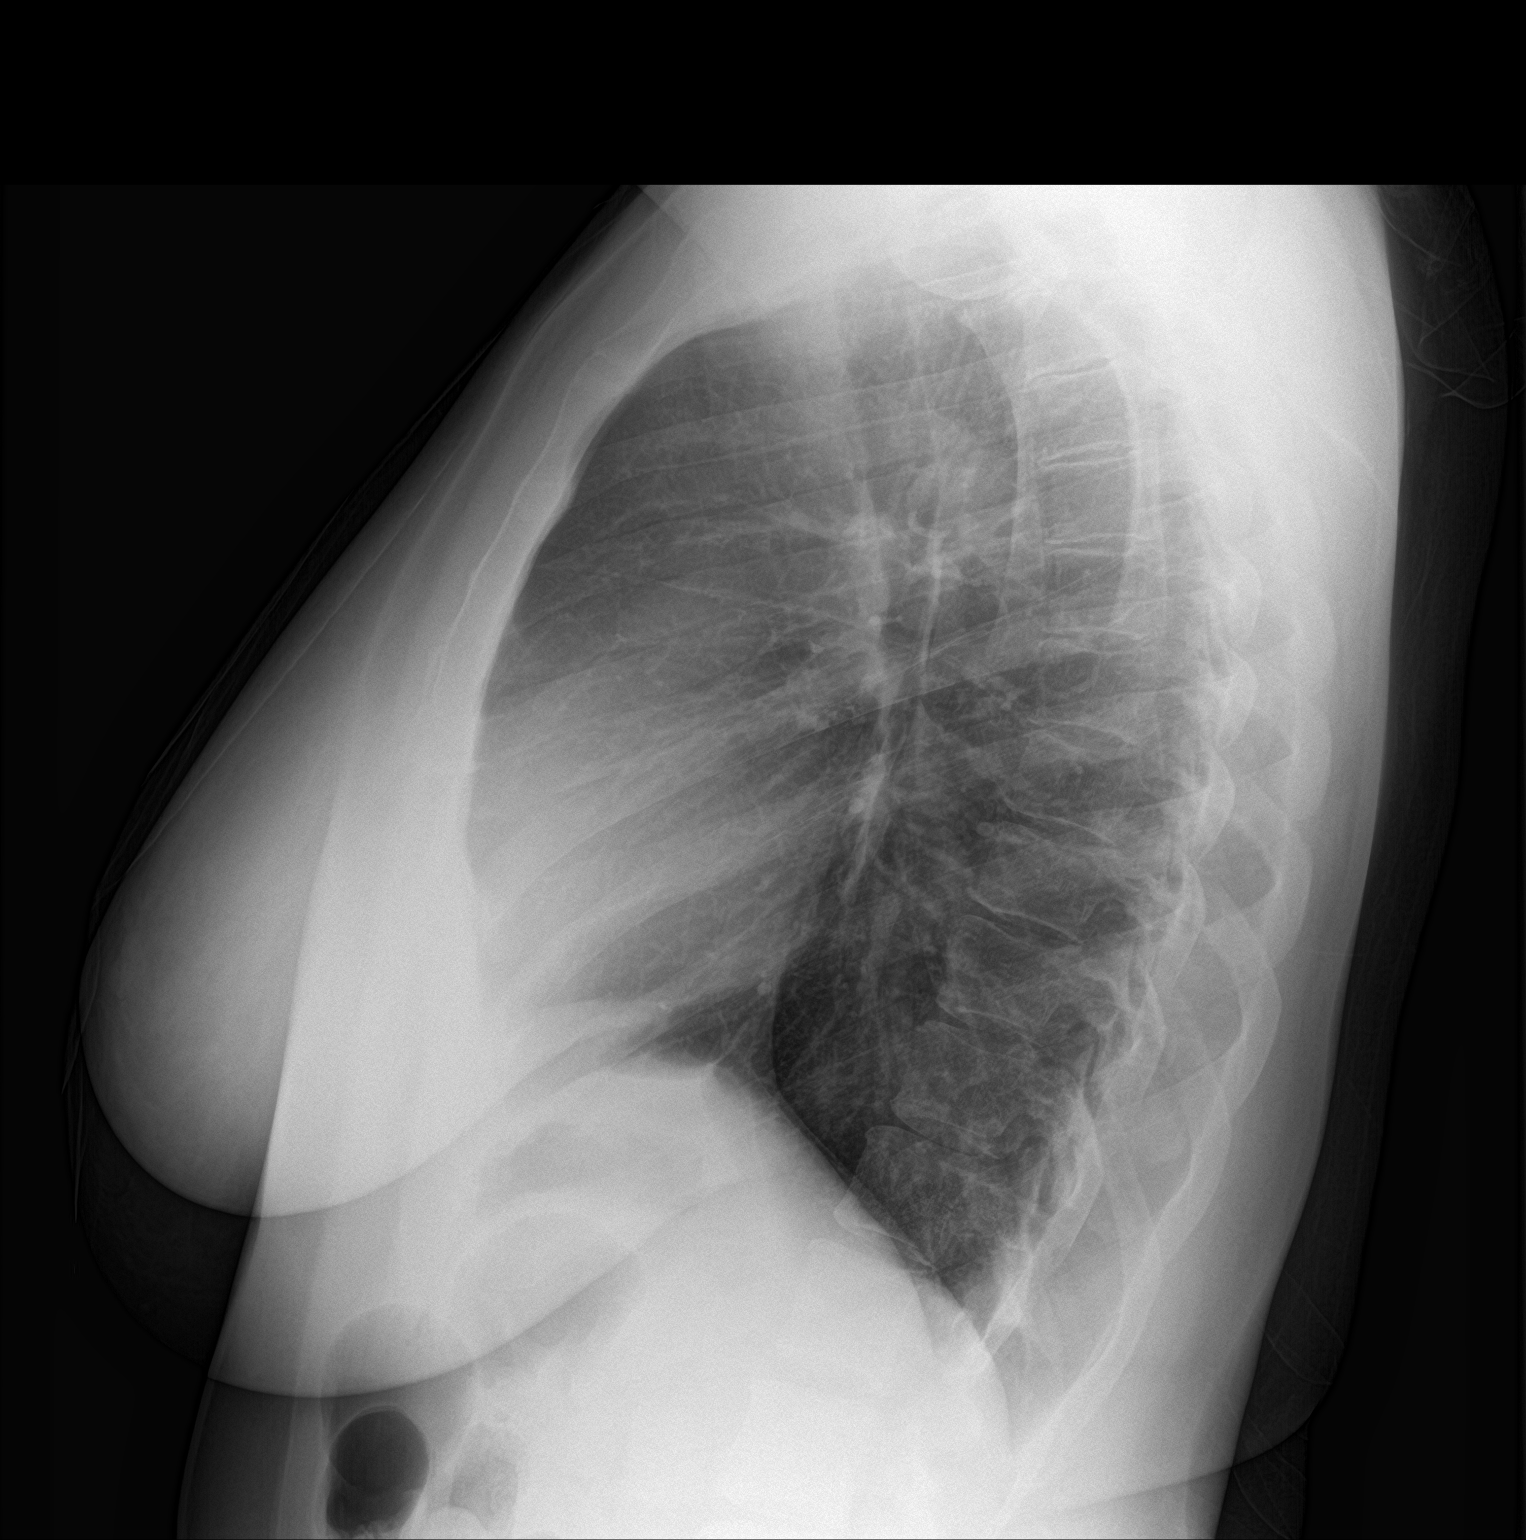

[2 of 2 positions shown; findings below may reference images not displayed]

FINDINGS: Heart size and mediastinal contours are within normal limits. Lungs
are clear. No pleural effusions seen. Osseous structures about the
chest are unremarkable.
IMPRESSION: No active cardiopulmonary disease. No evidence of pneumonia or
pulmonary edema.

## 2021-06-21 ENCOUNTER — Emergency Department (HOSPITAL_BASED_OUTPATIENT_CLINIC_OR_DEPARTMENT_OTHER)
Admission: EM | Admit: 2021-06-21 | Discharge: 2021-06-21 | Disposition: A | Discharge status: Home / Self Care | Payer: 59 | Attending: Emergency Medicine | Admitting: Emergency Medicine

## 2021-06-21 ENCOUNTER — Encounter (HOSPITAL_BASED_OUTPATIENT_CLINIC_OR_DEPARTMENT_OTHER): Payer: Self-pay

## 2021-06-21 ENCOUNTER — Other Ambulatory Visit: Payer: Self-pay

## 2021-06-21 DIAGNOSIS — L239 Allergic contact dermatitis, unspecified cause: Secondary | ICD-10-CM | POA: Insufficient documentation

## 2021-06-21 HISTORY — DX: Essential (primary) hypertension: I10

## 2021-06-21 MED ORDER — PREDNISONE 10 MG PO TABS: ORAL_TABLET | ORAL | 0 refills | Status: DC

## 2021-06-21 NOTE — ED Provider Notes (Signed)
[facility] EMERGENCY DEPARTMENT Provider Note   CSN: [id number] Arrival date & time: 06/21/21  1219     History  Chief Complaint  Patient presents with   Urticaria    Lacey Lara is a 51 y.o. female.  Pt complains of an itchy rash.  No no new medications no new foods.  Patient reports she has used over-the-counter hydrocortisone taken Benadryl and an old prescription for Atarax.  Patient denies any relief.  Patient denies any fever denies any chills she denies any joint swelling.  Patient reports she has similar in the past and was diagnosed with hives.  Patient reports she has had itching and has scratched several areas  The history is provided by the patient. No language interpreter was used.  Urticaria This is a new problem. The problem occurs constantly. The problem has not changed since onset.Pertinent negatives include no abdominal pain, no headaches and no shortness of breath. Nothing aggravates the symptoms. Nothing relieves the symptoms.      Home Medications Prior to Admission medications   Medication Sig Start Date End Date Taking? Authorizing Provider  predniSONE (DELTASONE) 10 MG tablet 6,5,4,3,2,1, taper 06/21/21  Yes Elson Areas, PA-C  benzonatate (TESSALON) 100 MG capsule Take 1 capsule (100 mg total) by mouth every 8 (eight) hours. 12/22/18   Petrucelli, Samantha R, PA-C  fluticasone (FLONASE) 50 MCG/ACT nasal spray Place 1 spray into both nostrils daily. 12/22/18   Petrucelli, Samantha R, PA-C  naproxen (NAPROSYN) 500 MG tablet Take 1 tablet (500 mg total) by mouth 2 (two) times daily. 01/09/16   Fayrene Helper, PA-C      Allergies    Shellfish allergy    Review of Systems   Review of Systems  Respiratory:  Negative for shortness of breath.   Gastrointestinal:  Negative for abdominal pain.  Neurological:  Negative for headaches.  All other systems reviewed and are negative.  Physical Exam Updated Vital Signs BP (!) 163/108 (BP Location:  Left Arm)    Pulse (!) 108    Temp 98.3 F (36.8 C) (Oral)    Resp 20    Ht 5\' 7"  (1.702 m)    Wt 83 kg    SpO2 100%    BMI 28.66 kg/m  Physical Exam Vitals and nursing note reviewed.  Constitutional:      Appearance: She is well-developed.  HENT:     Head: Normocephalic.     Nose: Nose normal.     Mouth/Throat:     Mouth: Mucous membranes are moist.  Eyes:     Pupils: Pupils are equal, round, and reactive to light.  Pulmonary:     Effort: Pulmonary effort is normal.  Abdominal:     General: There is no distension.  Musculoskeletal:        General: Normal range of motion.     Cervical back: Normal range of motion.  Skin:    Findings: Erythema and rash present.     Comments: Patient has multiple linear scratch marks.  Patient has small red raised areas  Neurological:     Mental Status: She is alert and oriented to person, place, and time.  Psychiatric:        Mood and Affect: Mood normal.    ED Results / Procedures / Treatments   Labs (all labs ordered are listed, but only abnormal results are displayed) Labs Reviewed - No data to display  EKG None  Radiology No results found.  Procedures Procedures  Medications Ordered in ED Medications - No data to display  ED Course/ Medical Decision Making/ A&P                           Medical Decision Making  Rash appears to be allergic.  Patient advised to stop topical hydrocortisone she can continue Atarax patient is given a prescription for prednisone taper.  Patient is advised to follow-up with her primary care physician next week for recheck.        Final Clinical Impression(s) / ED Diagnoses Final diagnoses:  Allergic dermatitis    Rx / DC Orders ED Discharge Orders          Ordered    predniSONE (DELTASONE) 10 MG tablet        06/21/21 1348           An After Visit Summary was printed and given to the patient.    Sidney Ace 06/21/21 1946    Truddie Hidden, MD 06/22/21  1325

## 2021-06-21 NOTE — ED Triage Notes (Signed)
Pt c/o scattered hives x 3 days-no relief with zyrtec and atarax-NAD-steady gait

## 2021-06-21 NOTE — Discharge Instructions (Addendum)
See your Physician for recheck next week  °

## 2022-01-19 ENCOUNTER — Encounter (HOSPITAL_COMMUNITY): Payer: Self-pay | Admitting: Emergency Medicine

## 2022-01-19 ENCOUNTER — Ambulatory Visit (HOSPITAL_COMMUNITY)
Admission: EM | Admit: 2022-01-19 | Discharge: 2022-01-19 | Disposition: A | Discharge status: Home / Self Care | Payer: Commercial Managed Care - HMO | Attending: Emergency Medicine | Admitting: Emergency Medicine

## 2022-01-19 DIAGNOSIS — J019 Acute sinusitis, unspecified: Secondary | ICD-10-CM | POA: Diagnosis not present

## 2022-01-19 MED ORDER — AMOXICILLIN-POT CLAVULANATE 875-125 MG PO TABS: 1.0000 | ORAL_TABLET | Freq: Two times a day (BID) | ORAL | 0 refills | Status: AC

## 2022-01-19 MED ORDER — ALBUTEROL SULFATE HFA 108 (90 BASE) MCG/ACT IN AERS: 2.0000 | INHALATION_SPRAY | Freq: Four times a day (QID) | RESPIRATORY_TRACT | 3 refills | Status: AC | PRN

## 2022-01-19 NOTE — ED Provider Notes (Signed)
[facility]    CSN: [id number] Arrival date & time: 01/19/22  1508      History   Chief Complaint Chief Complaint  Patient presents with   Headache   Cough   Fatigue    HPI Lacey Lara is a 51 y.o. female.  Presents with 10-day history of sinus pressure and pain.  Reports she has been congested with lots of postnasal drip.  Pain in the front of her face, especially between the eyebrows.  She has been trying Sudafed for her symptoms which worked for a couple days, but then symptoms returned.  The last 2 to 3 days she has developed some fatigue and cough.  Reports it is a dry cough that sometimes irritates her chest.  History of asthma, is low on her inhaler.  Denies any fever, chills, sore throat, shortness of breath or trouble breathing, chest pain, abdominal pain, nausea, vomiting/diarrhea.  Past Medical History:  Diagnosis Date   Asthma    Cyst of branchial cleft    Hypertension     There are no problems to display for this patient.   Past Surgical History:  Procedure Laterality Date   CERVICAL ABLATION     CESAREAN SECTION     HAND SURGERY     HERNIA REPAIR     TONSILLECTOMY      OB History   No obstetric history on file.      Home Medications    Prior to Admission medications   Medication Sig Start Date End Date Taking? Authorizing Provider  albuterol (VENTOLIN HFA) 108 (90 Base) MCG/ACT inhaler Inhale 2 puffs into the lungs every 6 (six) hours as needed for wheezing or shortness of breath. 01/19/22  Yes Adrien Shankar, Lurena Joiner, PA-C  amoxicillin-clavulanate (AUGMENTIN) 875-125 MG tablet Take 1 tablet by mouth 2 (two) times daily for 7 days. 01/19/22 01/26/22 Yes Clorinda Wyble, Ray Church    Family History History reviewed. No pertinent family history.  Social History Social History   Tobacco Use   Smoking status: Never   Smokeless tobacco: Never  Substance Use Topics   Alcohol use: Yes    Comment: occ   Drug use: No     Allergies    Shellfish allergy   Review of Systems Review of Systems  Respiratory:  Positive for cough.   Neurological:  Positive for headaches.   Per HPI  Physical Exam Triage Vital Signs ED Triage Vitals  Enc Vitals Group     BP 01/19/22 1540 123/87     Pulse Rate 01/19/22 1540 (!) 108     Resp 01/19/22 1540 18     Temp 01/19/22 1540 98 F (36.7 C)     Temp Source 01/19/22 1540 Oral     SpO2 01/19/22 1540 99 %     Weight --      Height --      Head Circumference --      Peak Flow --      Pain Score 01/19/22 1539 8     Pain Loc --      Pain Edu? --      Excl. in GC? --    No data found.  Updated Vital Signs BP 123/87 (BP Location: Right Arm)   Pulse (!) 108   Temp 98 F (36.7 C) (Oral)   Resp 18   SpO2 99%   Physical Exam Vitals and nursing note reviewed.  Constitutional:      General: She is not in acute distress. HENT:  Right Ear: Tympanic membrane and ear canal normal.     Left Ear: Tympanic membrane and ear canal normal.     Nose: Congestion present.     Right Turbinates: Enlarged.     Left Turbinates: Enlarged.     Right Sinus: Maxillary sinus tenderness and frontal sinus tenderness present.     Left Sinus: Maxillary sinus tenderness and frontal sinus tenderness present.     Mouth/Throat:     Mouth: Mucous membranes are moist.     Pharynx: Uvula midline. No posterior oropharyngeal erythema.     Tonsils: No tonsillar exudate or tonsillar abscesses.  Eyes:     Conjunctiva/sclera: Conjunctivae normal.     Pupils: Pupils are equal, round, and reactive to light.  Cardiovascular:     Rate and Rhythm: Normal rate and regular rhythm.     Heart sounds: Normal heart sounds.  Pulmonary:     Effort: Pulmonary effort is normal. No respiratory distress.     Breath sounds: Normal breath sounds. No wheezing.  Abdominal:     General: Bowel sounds are normal.     Palpations: Abdomen is soft.     Tenderness: There is no abdominal tenderness.  Musculoskeletal:      Cervical back: Normal range of motion.  Lymphadenopathy:     Cervical: No cervical adenopathy.  Neurological:     Mental Status: She is alert and oriented to person, place, and time.      UC Treatments / Results  Labs (all labs ordered are listed, but only abnormal results are displayed) Labs Reviewed - No data to display  EKG  Radiology No results found.  Procedures Procedures   Medications Ordered in UC Medications - No data to display  Initial Impression / Assessment and Plan / UC Course  I have reviewed the triage vital signs and the nursing notes.  Pertinent labs & imaging results that were available during my care of the patient were reviewed by me and considered in my medical decision making (see chart for details).  With duration of symptoms it is likely bacterial sinus infection.  We will treat with Augmentin twice daily for 7 days.  Patient understands to return if she feels the medicine does not improve her symptoms.  Other symptomatic care at home as needed.  Refilled inhaler.  Lung exam clear, patient denies any shortness of breath or chest pain.  Strict return precautions and ED precautions.  Patient agrees to plan  Final Clinical Impressions(s) / UC Diagnoses   Final diagnoses:  Acute non-recurrent sinusitis, unspecified location     Discharge Instructions      Please take medication as prescribed. I recommend to take with food.  I have refilled your inhaler. Please go to the emergency department if symptoms worsen.    ED Prescriptions     Medication Sig Dispense Auth. Provider   albuterol (VENTOLIN HFA) 108 (90 Base) MCG/ACT inhaler Inhale 2 puffs into the lungs every 6 (six) hours as needed for wheezing or shortness of breath. 18 g Lillianne Eick, PA-C   amoxicillin-clavulanate (AUGMENTIN) 875-125 MG tablet Take 1 tablet by mouth 2 (two) times daily for 7 days. 14 tablet Kahari Critzer, Lurena Joiner, PA-C      PDMP not reviewed this encounter.   Blane Worthington,  Ray Church 01/19/22 1704

## 2022-01-19 NOTE — Discharge Instructions (Addendum)
Please take medication as prescribed. I recommend to take with food.  I have refilled your inhaler. Please go to the emergency department if symptoms worsen.

## 2022-01-19 NOTE — ED Triage Notes (Signed)
Pt reports pressure in the front of her head and pressure behind eyes x 10 days. States she tried OTC Sudafed with no improvement. Also c/o chest tightness, fatigue, post nasal drainage and pain when coughing x 7 days. Hx of Asthma. Has been using inhaler. Last use of inhaler yesterday evening.

## 2022-07-30 ENCOUNTER — Ambulatory Visit (HOSPITAL_COMMUNITY)
Admission: EM | Admit: 2022-07-30 | Discharge: 2022-07-30 | Disposition: A | Discharge status: Home / Self Care | Payer: Commercial Managed Care - HMO | Attending: Family Medicine | Admitting: Family Medicine

## 2022-07-30 ENCOUNTER — Other Ambulatory Visit: Payer: Self-pay

## 2022-07-30 ENCOUNTER — Encounter (HOSPITAL_COMMUNITY): Payer: Self-pay | Admitting: *Deleted

## 2022-07-30 DIAGNOSIS — R8290 Unspecified abnormal findings in urine: Secondary | ICD-10-CM | POA: Insufficient documentation

## 2022-07-30 DIAGNOSIS — R102 Pelvic and perineal pain: Secondary | ICD-10-CM | POA: Insufficient documentation

## 2022-07-30 LAB — POCT URINALYSIS DIPSTICK, ED / UC
Bilirubin Urine: NEGATIVE
Glucose, UA: NEGATIVE mg/dL
Hgb urine dipstick: NEGATIVE
Ketones, ur: NEGATIVE mg/dL
Leukocytes,Ua: NEGATIVE
Nitrite: NEGATIVE
Protein, ur: NEGATIVE mg/dL
Specific Gravity, Urine: 1.025 (ref 1.005–1.030)
Urobilinogen, UA: 0.2 mg/dL (ref 0.0–1.0)
pH: 6 (ref 5.0–8.0)

## 2022-07-30 MED ORDER — NITROFURANTOIN MONOHYD MACRO 100 MG PO CAPS: 100.0000 mg | ORAL_CAPSULE | Freq: Two times a day (BID) | ORAL | 0 refills | Status: AC

## 2022-07-30 NOTE — ED Triage Notes (Signed)
Pt reports a strong odor to urin. Pt also has back and ABD pain. This has been going on for 2 weeks.

## 2022-07-30 NOTE — Discharge Instructions (Addendum)
The urinalysis was clear  We are sending a urine culture.  Staff will notify you if the the antibiotic needs to be changed.  Take nitrofurantoin 100 mg--1 capsule 2 times daily for 5 days   Staff will also notify you if there is anything positive on the swab

## 2022-07-30 NOTE — ED Provider Notes (Signed)
[facility]    CSN: [id number] Arrival date & time: 07/30/22  1631      History   Chief Complaint Chief Complaint  Patient presents with   Abdominal Pain    My urine has a strong smell of Amonia. Can't get rid of it. Adominal Pain. Back pain. Fatigue. - Entered by patient    HPI Lacey Lara is a 52 y.o. female.    Abdominal Pain  Here for a 2-week history of her urine being very strong smelling.  It smells like ammonia.  She is also had some suprapubic pain and some low back pain.  She has not had any urinary frequency or dysuria.  No fever or chills.  Last menstrual cycle was 10 years ago when she had an ablation.  Past Medical History:  Diagnosis Date   Asthma    Cyst of branchial cleft    Hypertension     There are no problems to display for this patient.   Past Surgical History:  Procedure Laterality Date   CERVICAL ABLATION     CESAREAN SECTION     HAND SURGERY     HERNIA REPAIR     TONSILLECTOMY      OB History   No obstetric history on file.      Home Medications    Prior to Admission medications   Medication Sig Start Date End Date Taking? Authorizing Provider  nitrofurantoin, macrocrystal-monohydrate, (MACROBID) 100 MG capsule Take 1 capsule (100 mg total) by mouth 2 (two) times daily. 07/30/22  Yes Barrett Henle, MD  albuterol (VENTOLIN HFA) 108 (90 Base) MCG/ACT inhaler Inhale 2 puffs into the lungs every 6 (six) hours as needed for wheezing or shortness of breath. 01/19/22   Rising, Vernice Jefferson    Family History History reviewed. No pertinent family history.  Social History Social History   Tobacco Use   Smoking status: Never   Smokeless tobacco: Never  Substance Use Topics   Alcohol use: Yes    Comment: occ   Drug use: No     Allergies   Shellfish allergy   Review of Systems Review of Systems  Gastrointestinal:  Positive for abdominal pain.     Physical Exam Triage Vital Signs ED Triage Vitals   Enc Vitals Group     BP 07/30/22 1820 135/88     Pulse Rate 07/30/22 1820 83     Resp 07/30/22 1820 18     Temp 07/30/22 1820 97.8 F (36.6 C)     Temp src --      SpO2 07/30/22 1820 99 %     Weight --      Height --      Head Circumference --      Peak Flow --      Pain Score 07/30/22 1817 4     Pain Loc --      Pain Edu? --      Excl. in [place]? --    No data found.  Updated Vital Signs BP 135/88   Pulse 83   Temp 97.8 F (36.6 C)   Resp 18   SpO2 99%   Visual Acuity Right Eye Distance:   Left Eye Distance:   Bilateral Distance:    Right Eye Near:   Left Eye Near:    Bilateral Near:     Physical Exam Vitals reviewed.  Constitutional:      General: She is not in acute distress.    Appearance: She  is not toxic-appearing.  HENT:     Nose: Nose normal.     Mouth/Throat:     Mouth: Mucous membranes are moist.     Pharynx: No oropharyngeal exudate or posterior oropharyngeal erythema.  Eyes:     Extraocular Movements: Extraocular movements intact.     Conjunctiva/sclera: Conjunctivae normal.     Pupils: Pupils are equal, round, and reactive to light.  Cardiovascular:     Rate and Rhythm: Normal rate and regular rhythm.     Heart sounds: No murmur heard. Pulmonary:     Effort: Pulmonary effort is normal. No respiratory distress.     Breath sounds: No wheezing, rhonchi or rales.  Chest:     Chest wall: No tenderness.  Abdominal:     Palpations: Abdomen is soft.     Tenderness: There is abdominal tenderness (suprapubic area).  Musculoskeletal:     Cervical back: Neck supple.  Lymphadenopathy:     Cervical: No cervical adenopathy.  Skin:    Capillary Refill: Capillary refill takes less than 2 seconds.     Coloration: Skin is not jaundiced or pale.  Neurological:     General: No focal deficit present.     Mental Status: She is alert and oriented to person, place, and time.  Psychiatric:        Behavior: Behavior normal.      UC Treatments / Results   Labs (all labs ordered are listed, but only abnormal results are displayed) Labs Reviewed  URINE CULTURE  POCT URINALYSIS DIPSTICK, ED / UC  CERVICOVAGINAL ANCILLARY ONLY    EKG   Radiology No results found.  Procedures Procedures (including critical care time)  Medications Ordered in UC Medications - No data to display  Initial Impression / Assessment and Plan / UC Course  I have reviewed the triage vital signs and the nursing notes.  Pertinent labs & imaging results that were available during my care of the patient were reviewed by me and considered in my medical decision making (see chart for details).        Urinalysis is clear.  Urine culture is sent and swab is also done.  Staff will notify her of any positives and treat per protocol Final Clinical Impressions(s) / UC Diagnoses   Final diagnoses:  Pelvic pain in female  Malodorous urine     Discharge Instructions      The urinalysis was clear  We are sending a urine culture.  Staff will notify you if the the antibiotic needs to be changed.  Take nitrofurantoin 100 mg--1 capsule 2 times daily for 5 days   Staff will also notify you if there is anything positive on the swab     ED Prescriptions     Medication Sig Dispense Auth. Provider   nitrofurantoin, macrocrystal-monohydrate, (MACROBID) 100 MG capsule Take 1 capsule (100 mg total) by mouth 2 (two) times daily. 10 capsule Barrett Henle, MD      PDMP not reviewed this encounter.   Barrett Henle, MD 07/30/22 [id number]

## 2022-07-31 LAB — CERVICOVAGINAL ANCILLARY ONLY
Bacterial Vaginitis (gardnerella): NEGATIVE
Candida Glabrata: NEGATIVE
Candida Vaginitis: NEGATIVE
Chlamydia: NEGATIVE
Comment: NEGATIVE
Comment: NEGATIVE
Comment: NEGATIVE
Comment: NEGATIVE
Comment: NEGATIVE
Comment: NORMAL
Neisseria Gonorrhea: NEGATIVE
Trichomonas: NEGATIVE

## 2022-07-31 LAB — URINE CULTURE: Culture: >=100000 — AB

## 2022-08-01 LAB — URINE CULTURE
